# Patient Record
Sex: Female | Born: 1965 | Race: White | Hispanic: No | Marital: Single
Health system: Southern US, Community
[De-identification: ages and names within clinical notes are randomized; demographics above are authoritative.]

---

## 1998-05-12 ENCOUNTER — Ambulatory Visit (HOSPITAL_COMMUNITY): Admission: RE | Admit: 1998-05-12 | Discharge: 1998-05-12 | Payer: Self-pay | Admitting: Obstetrics and Gynecology

## 1999-03-01 ENCOUNTER — Other Ambulatory Visit: Admission: RE | Admit: 1999-03-01 | Discharge: 1999-03-01 | Payer: Self-pay | Admitting: Obstetrics and Gynecology

## 2000-02-16 ENCOUNTER — Other Ambulatory Visit: Admission: RE | Admit: 2000-02-16 | Discharge: 2000-02-16 | Payer: Self-pay | Admitting: Obstetrics and Gynecology

## 2001-02-28 ENCOUNTER — Other Ambulatory Visit: Admission: RE | Admit: 2001-02-28 | Discharge: 2001-02-28 | Payer: Self-pay | Admitting: Obstetrics and Gynecology

## 2001-06-04 ENCOUNTER — Other Ambulatory Visit: Admission: RE | Admit: 2001-06-04 | Discharge: 2001-06-04 | Payer: Self-pay | Admitting: Internal Medicine

## 2001-09-07 ENCOUNTER — Other Ambulatory Visit: Admission: RE | Admit: 2001-09-07 | Discharge: 2001-09-07 | Payer: Self-pay | Admitting: Obstetrics and Gynecology

## 2002-02-28 ENCOUNTER — Other Ambulatory Visit: Admission: RE | Admit: 2002-02-28 | Discharge: 2002-02-28 | Payer: Self-pay | Admitting: Obstetrics and Gynecology

## 2003-03-03 ENCOUNTER — Other Ambulatory Visit: Admission: RE | Admit: 2003-03-03 | Discharge: 2003-03-03 | Payer: Self-pay | Admitting: Obstetrics and Gynecology

## 2004-03-04 ENCOUNTER — Other Ambulatory Visit: Admission: RE | Admit: 2004-03-04 | Discharge: 2004-03-04 | Payer: Self-pay | Admitting: Obstetrics and Gynecology

## 2004-03-23 ENCOUNTER — Encounter: Admission: RE | Admit: 2004-03-23 | Discharge: 2004-03-23 | Payer: Self-pay | Admitting: Obstetrics and Gynecology

## 2005-03-17 ENCOUNTER — Other Ambulatory Visit: Admission: RE | Admit: 2005-03-17 | Discharge: 2005-03-17 | Payer: Self-pay | Admitting: Obstetrics and Gynecology

## 2005-04-20 ENCOUNTER — Encounter: Admission: RE | Admit: 2005-04-20 | Discharge: 2005-04-20 | Payer: Self-pay | Admitting: Occupational Medicine

## 2005-05-10 ENCOUNTER — Encounter: Admission: RE | Admit: 2005-05-10 | Discharge: 2005-05-10 | Payer: Self-pay | Admitting: Obstetrics and Gynecology

## 2006-03-24 ENCOUNTER — Other Ambulatory Visit: Admission: RE | Admit: 2006-03-24 | Discharge: 2006-03-24 | Payer: Self-pay | Admitting: Obstetrics and Gynecology

## 2006-06-22 ENCOUNTER — Encounter: Admission: RE | Admit: 2006-06-22 | Discharge: 2006-06-22 | Payer: Self-pay | Admitting: Obstetrics and Gynecology

## 2006-06-29 ENCOUNTER — Encounter: Admission: RE | Admit: 2006-06-29 | Discharge: 2006-06-29 | Payer: Self-pay | Admitting: Obstetrics and Gynecology

## 2007-07-19 ENCOUNTER — Encounter: Admission: RE | Admit: 2007-07-19 | Discharge: 2007-07-19 | Payer: Self-pay | Admitting: Obstetrics and Gynecology

## 2008-10-17 ENCOUNTER — Encounter: Admission: RE | Admit: 2008-10-17 | Discharge: 2008-10-17 | Payer: Self-pay | Admitting: Obstetrics and Gynecology

## 2008-10-21 ENCOUNTER — Encounter: Admission: RE | Admit: 2008-10-21 | Discharge: 2008-10-21 | Payer: Self-pay | Admitting: Obstetrics and Gynecology

## 2009-10-19 ENCOUNTER — Encounter: Admission: RE | Admit: 2009-10-19 | Discharge: 2009-10-19 | Payer: Self-pay | Admitting: Obstetrics and Gynecology

## 2010-10-17 ENCOUNTER — Encounter: Payer: Self-pay | Admitting: Obstetrics and Gynecology

## 2010-10-20 ENCOUNTER — Encounter
Admission: RE | Admit: 2010-10-20 | Discharge: 2010-10-20 | Payer: Self-pay | Source: Home / Self Care | Attending: Obstetrics and Gynecology | Admitting: Obstetrics and Gynecology

## 2011-09-23 ENCOUNTER — Other Ambulatory Visit: Payer: Self-pay | Admitting: Obstetrics and Gynecology

## 2011-09-23 DIAGNOSIS — Z1231 Encounter for screening mammogram for malignant neoplasm of breast: Secondary | ICD-10-CM

## 2011-10-24 ENCOUNTER — Ambulatory Visit
Admission: RE | Admit: 2011-10-24 | Discharge: 2011-10-24 | Disposition: A | Discharge status: Home / Self Care | Payer: BC Managed Care – PPO | Source: Ambulatory Visit | Attending: Obstetrics and Gynecology | Admitting: Obstetrics and Gynecology

## 2011-10-24 DIAGNOSIS — Z1231 Encounter for screening mammogram for malignant neoplasm of breast: Secondary | ICD-10-CM

## 2012-09-27 ENCOUNTER — Other Ambulatory Visit: Payer: Self-pay | Admitting: Obstetrics and Gynecology

## 2012-09-27 DIAGNOSIS — Z1231 Encounter for screening mammogram for malignant neoplasm of breast: Secondary | ICD-10-CM

## 2012-10-24 ENCOUNTER — Ambulatory Visit: Payer: BC Managed Care – PPO

## 2012-10-29 ENCOUNTER — Ambulatory Visit
Admission: RE | Admit: 2012-10-29 | Discharge: 2012-10-29 | Disposition: A | Discharge status: Home / Self Care | Payer: BC Managed Care – PPO | Source: Ambulatory Visit | Attending: Obstetrics and Gynecology | Admitting: Obstetrics and Gynecology

## 2012-10-29 DIAGNOSIS — Z1231 Encounter for screening mammogram for malignant neoplasm of breast: Secondary | ICD-10-CM

## 2012-10-30 ENCOUNTER — Other Ambulatory Visit: Payer: Self-pay | Admitting: Obstetrics and Gynecology

## 2012-10-30 DIAGNOSIS — R928 Other abnormal and inconclusive findings on diagnostic imaging of breast: Secondary | ICD-10-CM

## 2012-11-09 ENCOUNTER — Other Ambulatory Visit: Payer: BC Managed Care – PPO

## 2012-11-15 ENCOUNTER — Ambulatory Visit
Admission: RE | Admit: 2012-11-15 | Discharge: 2012-11-15 | Disposition: A | Discharge status: Home / Self Care | Payer: BC Managed Care – PPO | Source: Ambulatory Visit | Attending: Obstetrics and Gynecology | Admitting: Obstetrics and Gynecology

## 2012-11-15 DIAGNOSIS — R928 Other abnormal and inconclusive findings on diagnostic imaging of breast: Secondary | ICD-10-CM

## 2013-10-18 ENCOUNTER — Other Ambulatory Visit: Payer: Self-pay

## 2013-10-18 DIAGNOSIS — Z1231 Encounter for screening mammogram for malignant neoplasm of breast: Secondary | ICD-10-CM

## 2013-11-06 ENCOUNTER — Ambulatory Visit
Admission: RE | Admit: 2013-11-06 | Discharge: 2013-11-06 | Disposition: A | Discharge status: Home / Self Care | Payer: No Typology Code available for payment source | Source: Ambulatory Visit

## 2013-11-06 DIAGNOSIS — Z1231 Encounter for screening mammogram for malignant neoplasm of breast: Secondary | ICD-10-CM

## 2014-10-09 ENCOUNTER — Other Ambulatory Visit: Payer: Self-pay

## 2014-10-09 DIAGNOSIS — Z1231 Encounter for screening mammogram for malignant neoplasm of breast: Secondary | ICD-10-CM

## 2014-11-10 ENCOUNTER — Ambulatory Visit: Payer: No Typology Code available for payment source

## 2014-11-13 ENCOUNTER — Ambulatory Visit
Admission: RE | Admit: 2014-11-13 | Discharge: 2014-11-13 | Disposition: A | Discharge status: Home / Self Care | Payer: No Typology Code available for payment source | Source: Ambulatory Visit

## 2014-11-13 DIAGNOSIS — Z1231 Encounter for screening mammogram for malignant neoplasm of breast: Secondary | ICD-10-CM

## 2015-10-14 ENCOUNTER — Other Ambulatory Visit: Payer: Self-pay

## 2015-10-14 DIAGNOSIS — Z1231 Encounter for screening mammogram for malignant neoplasm of breast: Secondary | ICD-10-CM

## 2015-11-16 ENCOUNTER — Ambulatory Visit
Admission: RE | Admit: 2015-11-16 | Discharge: 2015-11-16 | Disposition: A | Discharge status: Home / Self Care | Payer: 59 | Source: Ambulatory Visit

## 2015-11-16 DIAGNOSIS — Z1231 Encounter for screening mammogram for malignant neoplasm of breast: Secondary | ICD-10-CM

## 2016-10-17 ENCOUNTER — Other Ambulatory Visit: Payer: Self-pay | Admitting: Family Medicine

## 2016-10-17 DIAGNOSIS — Z1231 Encounter for screening mammogram for malignant neoplasm of breast: Secondary | ICD-10-CM

## 2016-11-16 ENCOUNTER — Ambulatory Visit
Admission: RE | Admit: 2016-11-16 | Discharge: 2016-11-16 | Disposition: A | Discharge status: Home / Self Care | Payer: 59 | Source: Ambulatory Visit | Attending: Family Medicine | Admitting: Family Medicine

## 2016-11-16 DIAGNOSIS — Z1231 Encounter for screening mammogram for malignant neoplasm of breast: Secondary | ICD-10-CM

## 2017-07-03 DIAGNOSIS — I10 Essential (primary) hypertension: Secondary | ICD-10-CM | POA: Diagnosis not present

## 2017-07-03 DIAGNOSIS — Z13220 Encounter for screening for lipoid disorders: Secondary | ICD-10-CM | POA: Diagnosis not present

## 2017-07-04 ENCOUNTER — Other Ambulatory Visit (HOSPITAL_COMMUNITY)
Admission: RE | Admit: 2017-07-04 | Discharge: 2017-07-04 | Disposition: A | Discharge status: Home / Self Care | Payer: 59 | Source: Ambulatory Visit | Attending: Family Medicine | Admitting: Family Medicine

## 2017-07-04 ENCOUNTER — Other Ambulatory Visit: Payer: Self-pay | Admitting: Family Medicine

## 2017-07-04 DIAGNOSIS — Z01411 Encounter for gynecological examination (general) (routine) with abnormal findings: Secondary | ICD-10-CM | POA: Diagnosis not present

## 2017-07-04 DIAGNOSIS — I10 Essential (primary) hypertension: Secondary | ICD-10-CM | POA: Diagnosis not present

## 2017-07-04 DIAGNOSIS — Z13220 Encounter for screening for lipoid disorders: Secondary | ICD-10-CM | POA: Diagnosis not present

## 2017-07-04 DIAGNOSIS — Z23 Encounter for immunization: Secondary | ICD-10-CM | POA: Diagnosis not present

## 2017-07-04 DIAGNOSIS — Z124 Encounter for screening for malignant neoplasm of cervix: Secondary | ICD-10-CM | POA: Diagnosis not present

## 2017-07-04 DIAGNOSIS — Z0000 Encounter for general adult medical examination without abnormal findings: Secondary | ICD-10-CM | POA: Diagnosis not present

## 2017-07-05 LAB — CYTOLOGY - PAP
Diagnosis: NEGATIVE
HPV (WINDOPATH): NOT DETECTED

## 2017-08-30 DIAGNOSIS — D485 Neoplasm of uncertain behavior of skin: Secondary | ICD-10-CM | POA: Diagnosis not present

## 2017-08-30 DIAGNOSIS — D225 Melanocytic nevi of trunk: Secondary | ICD-10-CM | POA: Diagnosis not present

## 2017-08-30 DIAGNOSIS — Z86018 Personal history of other benign neoplasm: Secondary | ICD-10-CM | POA: Diagnosis not present

## 2017-08-30 DIAGNOSIS — L918 Other hypertrophic disorders of the skin: Secondary | ICD-10-CM | POA: Diagnosis not present

## 2017-08-30 DIAGNOSIS — Z23 Encounter for immunization: Secondary | ICD-10-CM | POA: Diagnosis not present

## 2017-08-30 DIAGNOSIS — D2222 Melanocytic nevi of left ear and external auricular canal: Secondary | ICD-10-CM | POA: Diagnosis not present

## 2017-10-09 ENCOUNTER — Other Ambulatory Visit: Payer: Self-pay | Admitting: Family Medicine

## 2017-10-09 DIAGNOSIS — Z139 Encounter for screening, unspecified: Secondary | ICD-10-CM

## 2017-11-17 ENCOUNTER — Ambulatory Visit
Admission: RE | Admit: 2017-11-17 | Discharge: 2017-11-17 | Disposition: A | Discharge status: Home / Self Care | Payer: 59 | Source: Ambulatory Visit | Attending: Family Medicine | Admitting: Family Medicine

## 2017-11-17 DIAGNOSIS — Z1231 Encounter for screening mammogram for malignant neoplasm of breast: Secondary | ICD-10-CM | POA: Diagnosis not present

## 2017-11-17 DIAGNOSIS — Z139 Encounter for screening, unspecified: Secondary | ICD-10-CM

## 2017-11-21 DIAGNOSIS — N920 Excessive and frequent menstruation with regular cycle: Secondary | ICD-10-CM | POA: Diagnosis not present

## 2017-11-21 DIAGNOSIS — G43909 Migraine, unspecified, not intractable, without status migrainosus: Secondary | ICD-10-CM | POA: Diagnosis not present

## 2017-11-21 DIAGNOSIS — R69 Illness, unspecified: Secondary | ICD-10-CM | POA: Diagnosis not present

## 2017-11-21 DIAGNOSIS — N951 Menopausal and female climacteric states: Secondary | ICD-10-CM | POA: Diagnosis not present

## 2018-02-21 DIAGNOSIS — R5382 Chronic fatigue, unspecified: Secondary | ICD-10-CM | POA: Diagnosis not present

## 2018-02-21 DIAGNOSIS — N951 Menopausal and female climacteric states: Secondary | ICD-10-CM | POA: Diagnosis not present

## 2018-02-21 DIAGNOSIS — M25511 Pain in right shoulder: Secondary | ICD-10-CM | POA: Diagnosis not present

## 2018-02-23 DIAGNOSIS — M67911 Unspecified disorder of synovium and tendon, right shoulder: Secondary | ICD-10-CM | POA: Diagnosis not present

## 2018-02-26 DIAGNOSIS — M7541 Impingement syndrome of right shoulder: Secondary | ICD-10-CM | POA: Diagnosis not present

## 2018-02-26 DIAGNOSIS — M25511 Pain in right shoulder: Secondary | ICD-10-CM | POA: Diagnosis not present

## 2018-03-26 DIAGNOSIS — R0600 Dyspnea, unspecified: Secondary | ICD-10-CM | POA: Diagnosis not present

## 2018-03-26 DIAGNOSIS — R0683 Snoring: Secondary | ICD-10-CM | POA: Diagnosis not present

## 2018-03-26 DIAGNOSIS — G4719 Other hypersomnia: Secondary | ICD-10-CM | POA: Diagnosis not present

## 2018-03-26 DIAGNOSIS — G478 Other sleep disorders: Secondary | ICD-10-CM | POA: Diagnosis not present

## 2018-04-16 DIAGNOSIS — G4733 Obstructive sleep apnea (adult) (pediatric): Secondary | ICD-10-CM | POA: Diagnosis not present

## 2018-05-15 DIAGNOSIS — G4733 Obstructive sleep apnea (adult) (pediatric): Secondary | ICD-10-CM | POA: Diagnosis not present

## 2018-06-14 DIAGNOSIS — Z23 Encounter for immunization: Secondary | ICD-10-CM | POA: Diagnosis not present

## 2018-06-15 DIAGNOSIS — G4733 Obstructive sleep apnea (adult) (pediatric): Secondary | ICD-10-CM | POA: Diagnosis not present

## 2018-06-18 DIAGNOSIS — G4733 Obstructive sleep apnea (adult) (pediatric): Secondary | ICD-10-CM | POA: Diagnosis not present

## 2018-06-19 DIAGNOSIS — G4733 Obstructive sleep apnea (adult) (pediatric): Secondary | ICD-10-CM | POA: Diagnosis not present

## 2018-07-15 DIAGNOSIS — G4733 Obstructive sleep apnea (adult) (pediatric): Secondary | ICD-10-CM | POA: Diagnosis not present

## 2018-07-23 DIAGNOSIS — I10 Essential (primary) hypertension: Secondary | ICD-10-CM | POA: Diagnosis not present

## 2018-07-23 DIAGNOSIS — Z0000 Encounter for general adult medical examination without abnormal findings: Secondary | ICD-10-CM | POA: Diagnosis not present

## 2018-07-23 DIAGNOSIS — Z13220 Encounter for screening for lipoid disorders: Secondary | ICD-10-CM | POA: Diagnosis not present

## 2018-08-15 DIAGNOSIS — G4733 Obstructive sleep apnea (adult) (pediatric): Secondary | ICD-10-CM | POA: Diagnosis not present

## 2018-08-20 DIAGNOSIS — G4733 Obstructive sleep apnea (adult) (pediatric): Secondary | ICD-10-CM | POA: Diagnosis not present

## 2018-09-14 DIAGNOSIS — G4733 Obstructive sleep apnea (adult) (pediatric): Secondary | ICD-10-CM | POA: Diagnosis not present

## 2018-09-25 DIAGNOSIS — G4733 Obstructive sleep apnea (adult) (pediatric): Secondary | ICD-10-CM | POA: Diagnosis not present

## 2018-10-03 DIAGNOSIS — L309 Dermatitis, unspecified: Secondary | ICD-10-CM | POA: Diagnosis not present

## 2018-10-03 DIAGNOSIS — D2222 Melanocytic nevi of left ear and external auricular canal: Secondary | ICD-10-CM | POA: Diagnosis not present

## 2018-10-03 DIAGNOSIS — Z23 Encounter for immunization: Secondary | ICD-10-CM | POA: Diagnosis not present

## 2018-10-03 DIAGNOSIS — Z86018 Personal history of other benign neoplasm: Secondary | ICD-10-CM | POA: Diagnosis not present

## 2018-10-03 DIAGNOSIS — D225 Melanocytic nevi of trunk: Secondary | ICD-10-CM | POA: Diagnosis not present

## 2018-10-15 DIAGNOSIS — G4733 Obstructive sleep apnea (adult) (pediatric): Secondary | ICD-10-CM | POA: Diagnosis not present

## 2018-10-16 ENCOUNTER — Other Ambulatory Visit: Payer: Self-pay | Admitting: Family Medicine

## 2018-10-16 DIAGNOSIS — Z1231 Encounter for screening mammogram for malignant neoplasm of breast: Secondary | ICD-10-CM

## 2018-11-15 DIAGNOSIS — G4733 Obstructive sleep apnea (adult) (pediatric): Secondary | ICD-10-CM | POA: Diagnosis not present

## 2018-11-28 ENCOUNTER — Ambulatory Visit: Payer: 59

## 2018-12-05 ENCOUNTER — Ambulatory Visit: Payer: 59

## 2018-12-14 DIAGNOSIS — G4733 Obstructive sleep apnea (adult) (pediatric): Secondary | ICD-10-CM | POA: Diagnosis not present

## 2018-12-18 DIAGNOSIS — G4733 Obstructive sleep apnea (adult) (pediatric): Secondary | ICD-10-CM | POA: Diagnosis not present

## 2019-01-01 ENCOUNTER — Ambulatory Visit: Payer: 59

## 2019-01-14 DIAGNOSIS — G4733 Obstructive sleep apnea (adult) (pediatric): Secondary | ICD-10-CM | POA: Diagnosis not present

## 2019-02-05 ENCOUNTER — Ambulatory Visit
Admission: RE | Admit: 2019-02-05 | Discharge: 2019-02-05 | Disposition: A | Discharge status: Home / Self Care | Payer: 59 | Source: Ambulatory Visit | Attending: Family Medicine | Admitting: Family Medicine

## 2019-02-05 ENCOUNTER — Other Ambulatory Visit: Payer: Self-pay

## 2019-02-05 DIAGNOSIS — Z1231 Encounter for screening mammogram for malignant neoplasm of breast: Secondary | ICD-10-CM | POA: Diagnosis not present

## 2019-02-12 ENCOUNTER — Ambulatory Visit: Payer: 59

## 2019-02-13 DIAGNOSIS — G4733 Obstructive sleep apnea (adult) (pediatric): Secondary | ICD-10-CM | POA: Diagnosis not present

## 2019-04-05 DIAGNOSIS — G4733 Obstructive sleep apnea (adult) (pediatric): Secondary | ICD-10-CM | POA: Diagnosis not present

## 2019-07-05 DIAGNOSIS — Z23 Encounter for immunization: Secondary | ICD-10-CM | POA: Diagnosis not present

## 2019-07-05 DIAGNOSIS — G4733 Obstructive sleep apnea (adult) (pediatric): Secondary | ICD-10-CM | POA: Diagnosis not present

## 2019-08-05 DIAGNOSIS — G4733 Obstructive sleep apnea (adult) (pediatric): Secondary | ICD-10-CM | POA: Diagnosis not present

## 2019-08-21 DIAGNOSIS — G4733 Obstructive sleep apnea (adult) (pediatric): Secondary | ICD-10-CM | POA: Diagnosis not present

## 2019-08-30 DIAGNOSIS — Z0000 Encounter for general adult medical examination without abnormal findings: Secondary | ICD-10-CM | POA: Diagnosis not present

## 2019-09-04 DIAGNOSIS — G4733 Obstructive sleep apnea (adult) (pediatric): Secondary | ICD-10-CM | POA: Diagnosis not present

## 2019-09-13 DIAGNOSIS — E039 Hypothyroidism, unspecified: Secondary | ICD-10-CM | POA: Diagnosis not present

## 2019-09-13 DIAGNOSIS — I10 Essential (primary) hypertension: Secondary | ICD-10-CM | POA: Diagnosis not present

## 2019-09-13 DIAGNOSIS — Z13220 Encounter for screening for lipoid disorders: Secondary | ICD-10-CM | POA: Diagnosis not present

## 2019-09-15 DIAGNOSIS — B349 Viral infection, unspecified: Secondary | ICD-10-CM | POA: Diagnosis not present

## 2019-09-15 DIAGNOSIS — K529 Noninfective gastroenteritis and colitis, unspecified: Secondary | ICD-10-CM | POA: Diagnosis not present

## 2019-10-02 DIAGNOSIS — L578 Other skin changes due to chronic exposure to nonionizing radiation: Secondary | ICD-10-CM | POA: Diagnosis not present

## 2019-10-02 DIAGNOSIS — Z86018 Personal history of other benign neoplasm: Secondary | ICD-10-CM | POA: Diagnosis not present

## 2019-10-02 DIAGNOSIS — Z23 Encounter for immunization: Secondary | ICD-10-CM | POA: Diagnosis not present

## 2019-10-02 DIAGNOSIS — D2222 Melanocytic nevi of left ear and external auricular canal: Secondary | ICD-10-CM | POA: Diagnosis not present

## 2019-10-02 DIAGNOSIS — L931 Subacute cutaneous lupus erythematosus: Secondary | ICD-10-CM | POA: Diagnosis not present

## 2019-10-02 DIAGNOSIS — D225 Melanocytic nevi of trunk: Secondary | ICD-10-CM | POA: Diagnosis not present

## 2019-10-02 DIAGNOSIS — L309 Dermatitis, unspecified: Secondary | ICD-10-CM | POA: Diagnosis not present

## 2019-10-02 DIAGNOSIS — L820 Inflamed seborrheic keratosis: Secondary | ICD-10-CM | POA: Diagnosis not present

## 2019-10-03 DIAGNOSIS — G4733 Obstructive sleep apnea (adult) (pediatric): Secondary | ICD-10-CM | POA: Diagnosis not present

## 2019-11-03 DIAGNOSIS — G4733 Obstructive sleep apnea (adult) (pediatric): Secondary | ICD-10-CM | POA: Diagnosis not present

## 2019-12-01 DIAGNOSIS — G4733 Obstructive sleep apnea (adult) (pediatric): Secondary | ICD-10-CM | POA: Diagnosis not present

## 2019-12-05 ENCOUNTER — Ambulatory Visit: Payer: 59 | Attending: Internal Medicine

## 2019-12-05 DIAGNOSIS — Z23 Encounter for immunization: Secondary | ICD-10-CM

## 2019-12-05 NOTE — Progress Notes (Signed)
   Covid-19 Vaccination Clinic  Name:  Kimberly Cobb    MRN: [id number] DOB: [birth year]  12/05/2019  [name] was observed post Covid-19 immunization for 15 minutes without incident. She was provided with Vaccine Information Sheet and instruction to access the V-Safe system.   [name] was instructed to call 911 with any severe reactions post vaccine: Marland Kitchen Difficulty breathing  . Swelling of face and throat  . A fast heartbeat  . A bad rash all over body  . Dizziness and weakness   Immunizations Administered    Name Date Dose VIS Date Route   Moderna COVID-19 Vaccine 12/05/2019 10:45 AM 0.5 mL 08/27/2019 Intramuscular   Manufacturer: Moderna   Lot: [id number]   NDC: 41660-630-16

## 2019-12-11 DIAGNOSIS — L308 Other specified dermatitis: Secondary | ICD-10-CM | POA: Diagnosis not present

## 2019-12-11 DIAGNOSIS — L219 Seborrheic dermatitis, unspecified: Secondary | ICD-10-CM | POA: Diagnosis not present

## 2019-12-11 DIAGNOSIS — L309 Dermatitis, unspecified: Secondary | ICD-10-CM | POA: Diagnosis not present

## 2020-01-07 ENCOUNTER — Ambulatory Visit: Payer: 59 | Attending: Internal Medicine

## 2020-01-07 ENCOUNTER — Other Ambulatory Visit: Payer: Self-pay | Admitting: Family Medicine

## 2020-01-07 DIAGNOSIS — Z1231 Encounter for screening mammogram for malignant neoplasm of breast: Secondary | ICD-10-CM

## 2020-01-07 DIAGNOSIS — Z23 Encounter for immunization: Secondary | ICD-10-CM

## 2020-01-07 NOTE — Progress Notes (Signed)
   Covid-19 Vaccination Clinic  Name:  Kimberly Cobb    MRN: [id number] DOB: 12/15/1965  01/07/2020  Kimberly Cobb was observed post Covid-19 immunization for 15 minutes without incident. She was provided with Vaccine Information Sheet and instruction to access the V-Safe system.   Kimberly Cobb was instructed to call 911 with any severe reactions post vaccine: Marland Kitchen Difficulty breathing  . Swelling of face and throat  . A fast heartbeat  . A bad rash all over body  . Dizziness and weakness   Immunizations Administered    Name Date Dose VIS Date Route   Moderna COVID-19 Vaccine 01/07/2020 11:38 AM 0.5 mL 08/27/2019 Intramuscular   Manufacturer: Moderna   Lot: [id number]   NDC: 36681-594-70

## 2020-01-13 DIAGNOSIS — G4733 Obstructive sleep apnea (adult) (pediatric): Secondary | ICD-10-CM | POA: Diagnosis not present

## 2020-01-25 IMAGING — MG DIGITAL SCREENING BILATERAL MAMMOGRAM WITH TOMO AND CAD
8 series · 8 of 24 positions shown · non-contrast
Comparison: Previous exam(s).

CLINICAL DATA: Screening.

EXAM:
DIGITAL SCREENING BILATERAL MAMMOGRAM WITH TOMO AND CAD

[R CC synth-2D]
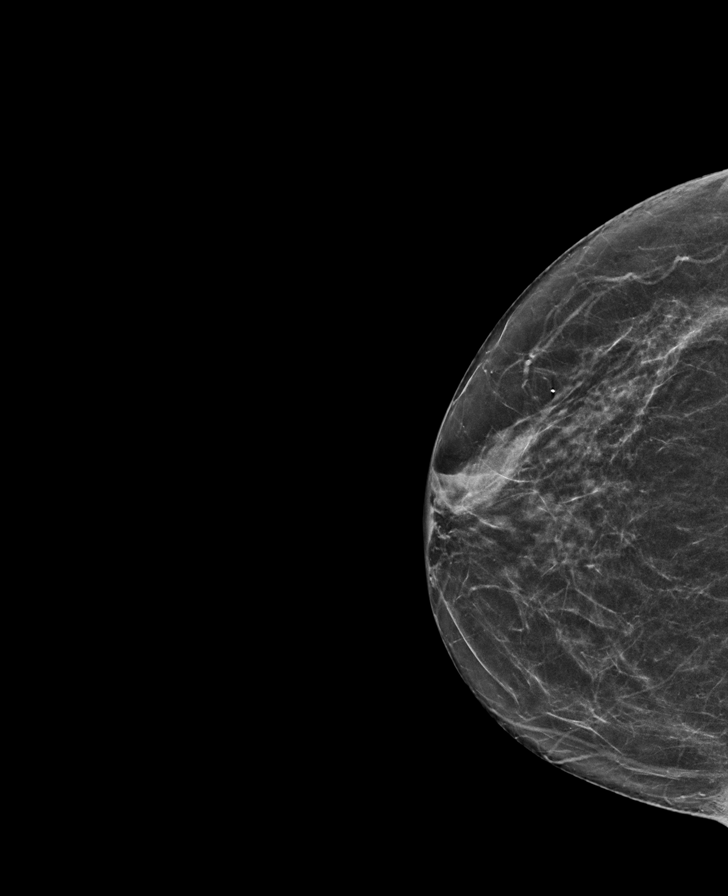

[R MLO synth-2D]
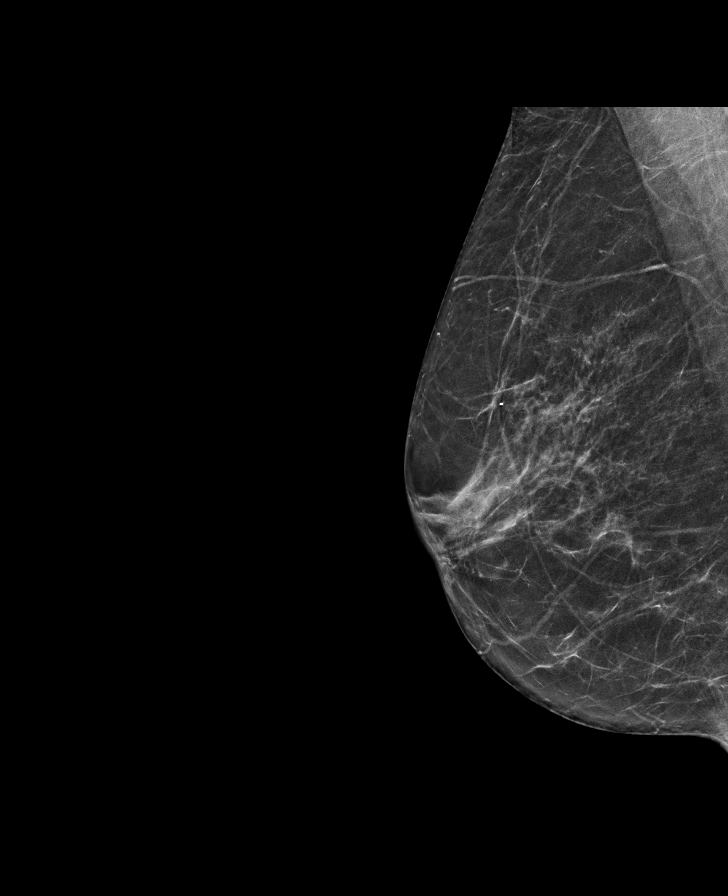

[L MLO synth-2D]
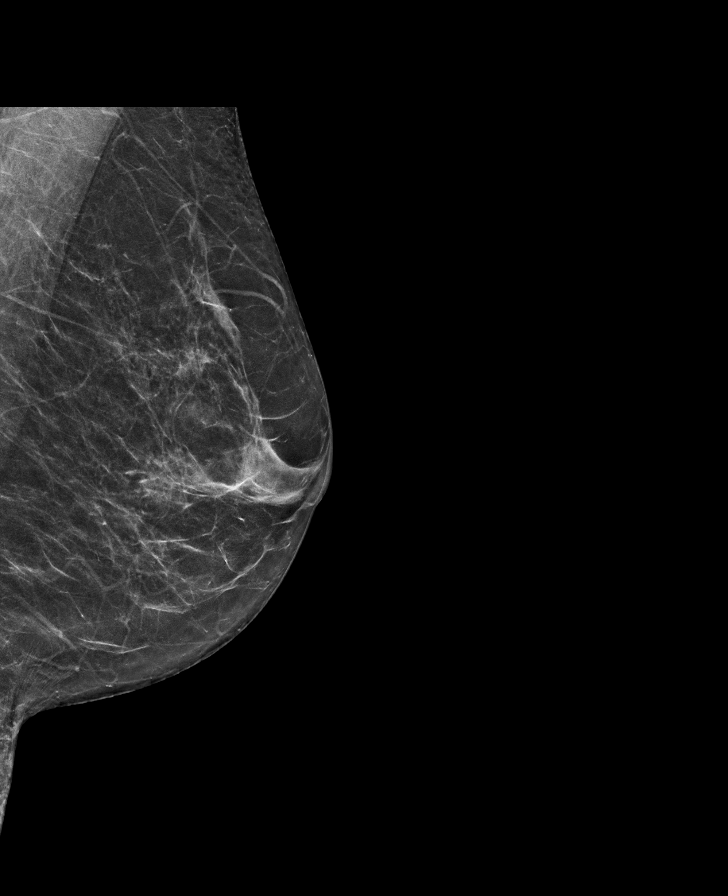

[L CC synth-2D]
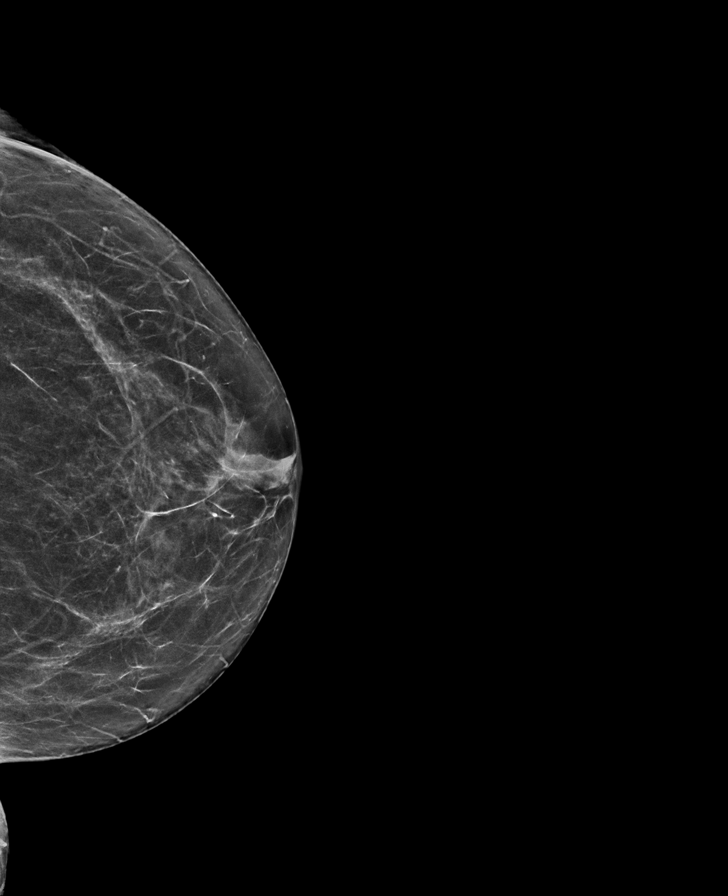

[L MLO tomo · tomo slice 33/65.0]
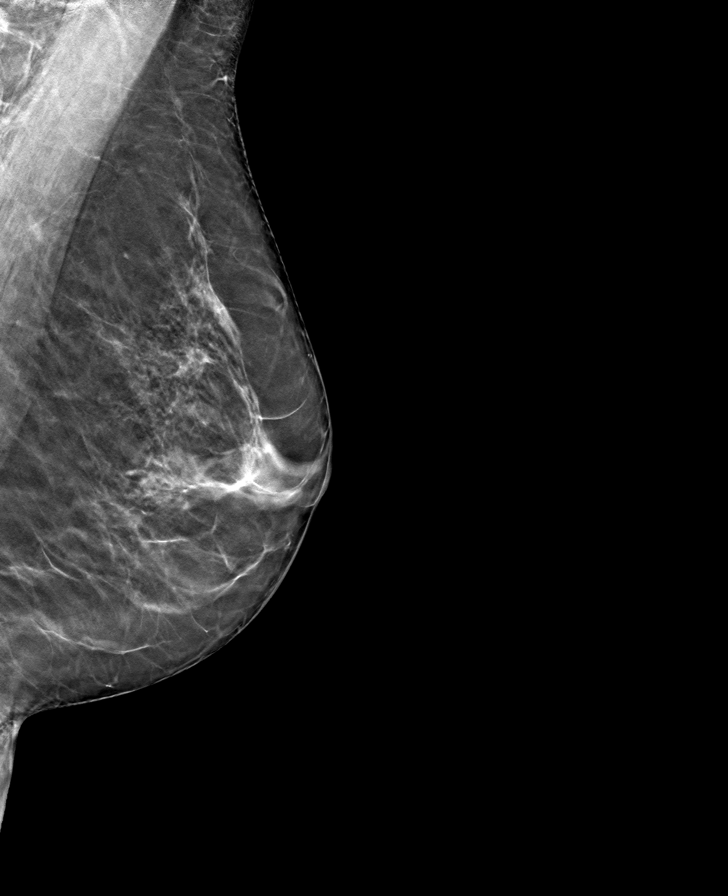

[L CC tomo · tomo slice 33/64.0]
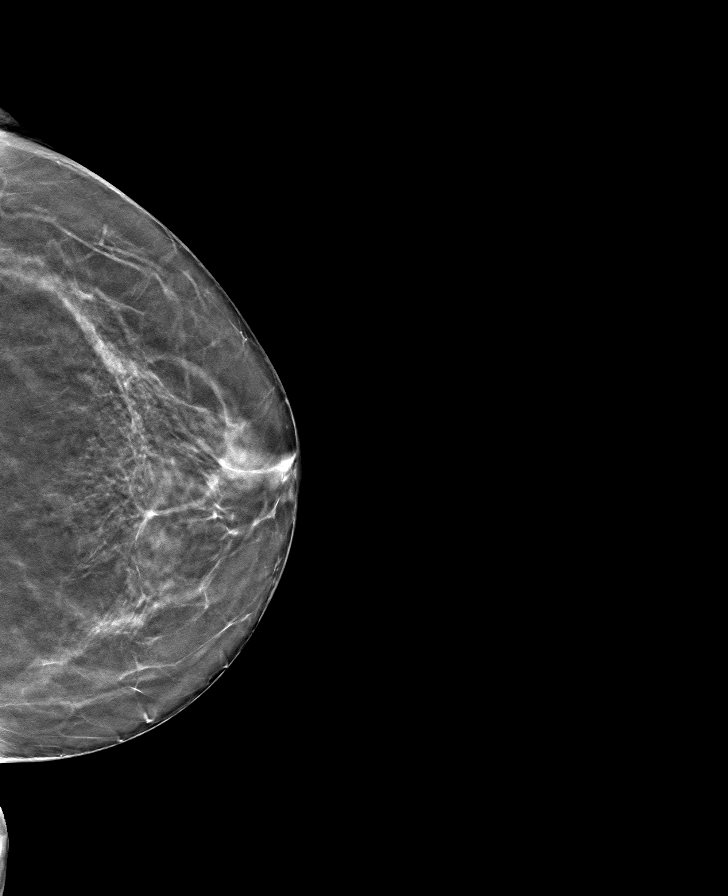

[R MLO tomo · tomo slice 33/66.0]
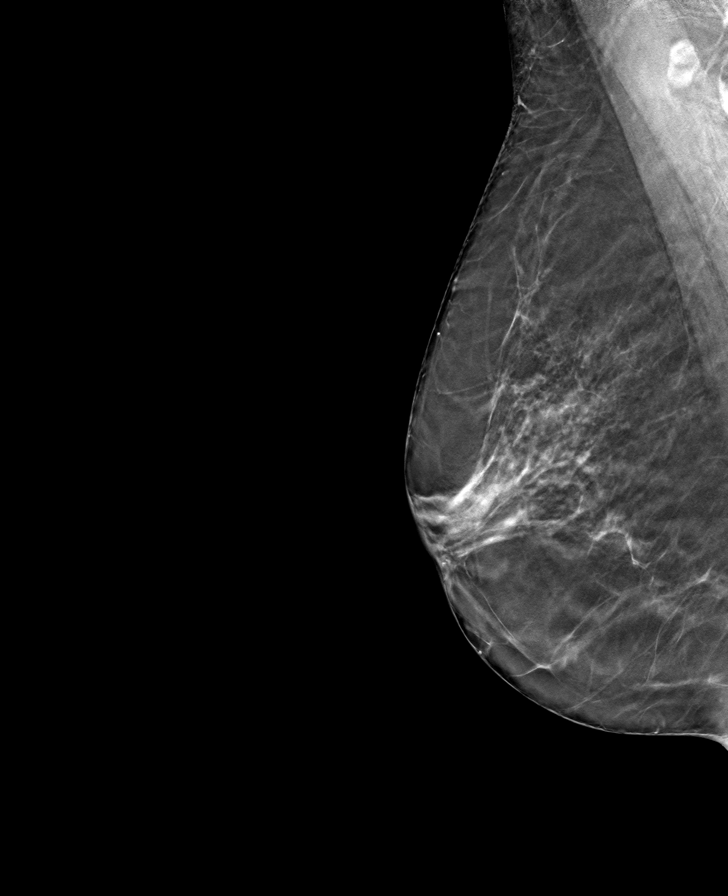

[R CC tomo · tomo slice 31/61.0]
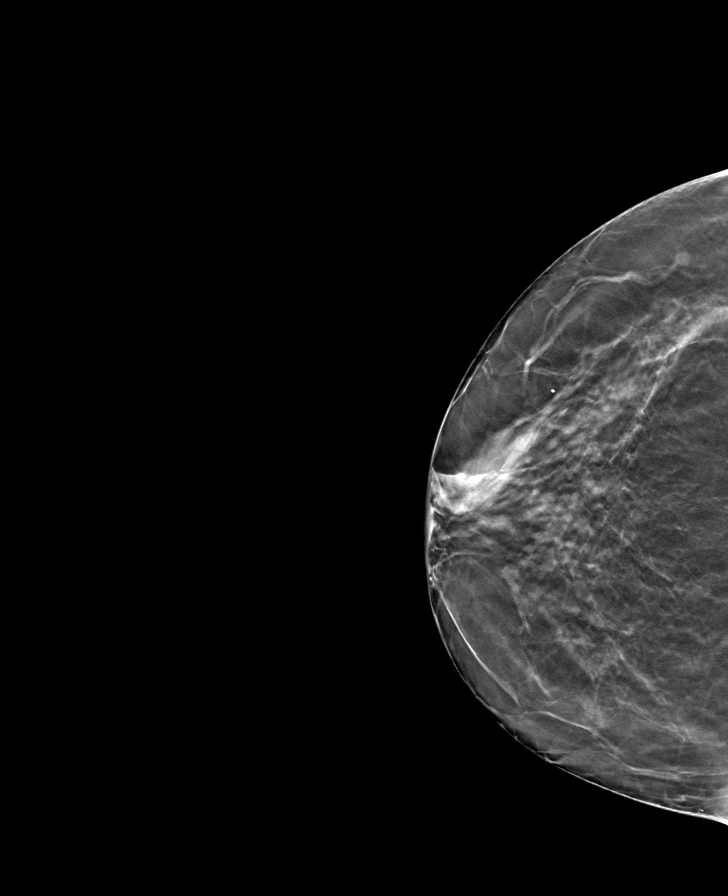

[8 of 24 positions shown; findings below may reference images not displayed]

ACR Breast Density Category b: There are scattered areas of
fibroglandular density.
FINDINGS: There are no findings suspicious for malignancy. Images were
processed with CAD.
IMPRESSION: No mammographic evidence of malignancy. A result letter of this
screening mammogram will be mailed directly to the patient.

RECOMMENDATION:
Screening mammogram in one year. (Code:[id number])

BI-RADS CATEGORY  1: Negative.

## 2020-02-12 DIAGNOSIS — G4733 Obstructive sleep apnea (adult) (pediatric): Secondary | ICD-10-CM | POA: Diagnosis not present

## 2020-03-14 DIAGNOSIS — G4733 Obstructive sleep apnea (adult) (pediatric): Secondary | ICD-10-CM | POA: Diagnosis not present

## 2020-03-17 ENCOUNTER — Other Ambulatory Visit: Payer: Self-pay

## 2020-03-17 ENCOUNTER — Ambulatory Visit
Admission: RE | Admit: 2020-03-17 | Discharge: 2020-03-17 | Disposition: A | Discharge status: Home / Self Care | Payer: 59 | Source: Ambulatory Visit | Attending: Family Medicine | Admitting: Family Medicine

## 2020-03-17 DIAGNOSIS — Z1231 Encounter for screening mammogram for malignant neoplasm of breast: Secondary | ICD-10-CM | POA: Diagnosis not present

## 2020-04-23 DIAGNOSIS — G4733 Obstructive sleep apnea (adult) (pediatric): Secondary | ICD-10-CM | POA: Diagnosis not present

## 2020-05-24 DIAGNOSIS — G4733 Obstructive sleep apnea (adult) (pediatric): Secondary | ICD-10-CM | POA: Diagnosis not present

## 2020-06-02 DIAGNOSIS — G4733 Obstructive sleep apnea (adult) (pediatric): Secondary | ICD-10-CM | POA: Diagnosis not present

## 2020-06-24 DIAGNOSIS — G4733 Obstructive sleep apnea (adult) (pediatric): Secondary | ICD-10-CM | POA: Diagnosis not present

## 2020-08-24 DIAGNOSIS — G4733 Obstructive sleep apnea (adult) (pediatric): Secondary | ICD-10-CM | POA: Diagnosis not present

## 2020-08-24 DIAGNOSIS — G4731 Primary central sleep apnea: Secondary | ICD-10-CM | POA: Diagnosis not present

## 2020-09-07 DIAGNOSIS — Z780 Asymptomatic menopausal state: Secondary | ICD-10-CM | POA: Diagnosis not present

## 2020-09-07 DIAGNOSIS — G4731 Primary central sleep apnea: Secondary | ICD-10-CM | POA: Diagnosis not present

## 2020-09-07 DIAGNOSIS — Z0000 Encounter for general adult medical examination without abnormal findings: Secondary | ICD-10-CM | POA: Diagnosis not present

## 2020-09-07 DIAGNOSIS — E039 Hypothyroidism, unspecified: Secondary | ICD-10-CM | POA: Diagnosis not present

## 2020-09-07 DIAGNOSIS — I10 Essential (primary) hypertension: Secondary | ICD-10-CM | POA: Diagnosis not present

## 2020-09-07 DIAGNOSIS — G4733 Obstructive sleep apnea (adult) (pediatric): Secondary | ICD-10-CM | POA: Diagnosis not present

## 2020-09-07 DIAGNOSIS — Z1211 Encounter for screening for malignant neoplasm of colon: Secondary | ICD-10-CM | POA: Diagnosis not present

## 2020-09-07 DIAGNOSIS — Z13220 Encounter for screening for lipoid disorders: Secondary | ICD-10-CM | POA: Diagnosis not present

## 2020-10-05 DIAGNOSIS — G4733 Obstructive sleep apnea (adult) (pediatric): Secondary | ICD-10-CM | POA: Diagnosis not present

## 2020-10-07 DIAGNOSIS — Z1211 Encounter for screening for malignant neoplasm of colon: Secondary | ICD-10-CM | POA: Diagnosis not present

## 2020-11-05 DIAGNOSIS — G4733 Obstructive sleep apnea (adult) (pediatric): Secondary | ICD-10-CM | POA: Diagnosis not present

## 2020-11-09 DIAGNOSIS — L578 Other skin changes due to chronic exposure to nonionizing radiation: Secondary | ICD-10-CM | POA: Diagnosis not present

## 2020-11-09 DIAGNOSIS — D2222 Melanocytic nevi of left ear and external auricular canal: Secondary | ICD-10-CM | POA: Diagnosis not present

## 2020-11-09 DIAGNOSIS — L930 Discoid lupus erythematosus: Secondary | ICD-10-CM | POA: Diagnosis not present

## 2020-11-09 DIAGNOSIS — Z86018 Personal history of other benign neoplasm: Secondary | ICD-10-CM | POA: Diagnosis not present

## 2020-11-09 DIAGNOSIS — D225 Melanocytic nevi of trunk: Secondary | ICD-10-CM | POA: Diagnosis not present

## 2020-11-09 DIAGNOSIS — L309 Dermatitis, unspecified: Secondary | ICD-10-CM | POA: Diagnosis not present

## 2020-12-03 DIAGNOSIS — G4733 Obstructive sleep apnea (adult) (pediatric): Secondary | ICD-10-CM | POA: Diagnosis not present

## 2020-12-24 ENCOUNTER — Other Ambulatory Visit: Payer: Self-pay | Admitting: *Deleted

## 2020-12-24 NOTE — Patient Outreach (Signed)
[facility]) Care Management  12/24/2020  Kimberly Cobb [birth year] [id number]  Unsuccessful outreach attempt made to patient. RN Health Coach left HIPAA compliant voicemail message along with her contact information.  Plan: RN Health Coach will call patient within the next several business days and will send an unsuccessful letter.    Blanchie Serve RN, BSN [facility] Care Management  RN Health Coach [phone] [email or web address]

## 2020-12-30 ENCOUNTER — Other Ambulatory Visit: Payer: Self-pay | Admitting: *Deleted

## 2020-12-30 NOTE — Patient Outreach (Signed)
[facility]) Care Management  12/30/2020  Kimberly Cobb Nov 24, 1965 [id number]  Unsuccessful outreach attempt made to patient. Nurse called patient to discuss [facility] services. Through conversation, the patient shared that her insurance changed to [place] on 12/25/20. Nurse explained that [facility] is not currently working with [employer]. Patient verbalized understanding.   Plan: RN Health Coach will close case.  Blanchie Serve RN, BSN [name] Care Management  RN Health Coach [phone] [email or web address]

## 2021-03-01 ENCOUNTER — Other Ambulatory Visit: Payer: Self-pay | Admitting: Family Medicine

## 2021-03-01 DIAGNOSIS — Z1231 Encounter for screening mammogram for malignant neoplasm of breast: Secondary | ICD-10-CM

## 2021-04-27 ENCOUNTER — Other Ambulatory Visit: Payer: Self-pay

## 2021-04-27 ENCOUNTER — Ambulatory Visit
Admission: RE | Admit: 2021-04-27 | Discharge: 2021-04-27 | Disposition: A | Discharge status: Home / Self Care | Payer: Managed Care, Other (non HMO) | Source: Ambulatory Visit

## 2021-04-27 DIAGNOSIS — Z1231 Encounter for screening mammogram for malignant neoplasm of breast: Secondary | ICD-10-CM

## 2022-04-06 DIAGNOSIS — K648 Other hemorrhoids: Secondary | ICD-10-CM | POA: Diagnosis not present

## 2022-04-06 DIAGNOSIS — Z86010 Personal history of colonic polyps: Secondary | ICD-10-CM | POA: Diagnosis not present

## 2022-04-26 DIAGNOSIS — G4733 Obstructive sleep apnea (adult) (pediatric): Secondary | ICD-10-CM | POA: Diagnosis not present

## 2022-05-04 DIAGNOSIS — L821 Other seborrheic keratosis: Secondary | ICD-10-CM | POA: Diagnosis not present

## 2022-05-04 DIAGNOSIS — L814 Other melanin hyperpigmentation: Secondary | ICD-10-CM | POA: Diagnosis not present

## 2022-05-04 DIAGNOSIS — D485 Neoplasm of uncertain behavior of skin: Secondary | ICD-10-CM | POA: Diagnosis not present

## 2022-05-04 DIAGNOSIS — D2260 Melanocytic nevi of unspecified upper limb, including shoulder: Secondary | ICD-10-CM | POA: Diagnosis not present

## 2022-05-09 ENCOUNTER — Other Ambulatory Visit: Payer: Self-pay | Admitting: Family Medicine

## 2022-05-09 DIAGNOSIS — Z1231 Encounter for screening mammogram for malignant neoplasm of breast: Secondary | ICD-10-CM

## 2022-05-20 ENCOUNTER — Ambulatory Visit
Admission: RE | Admit: 2022-05-20 | Discharge: 2022-05-20 | Disposition: A | Discharge status: Home / Self Care | Payer: BC Managed Care – PPO | Source: Ambulatory Visit | Attending: Family Medicine | Admitting: Family Medicine

## 2022-05-20 DIAGNOSIS — Z1231 Encounter for screening mammogram for malignant neoplasm of breast: Secondary | ICD-10-CM | POA: Diagnosis not present

## 2022-05-27 DIAGNOSIS — G4733 Obstructive sleep apnea (adult) (pediatric): Secondary | ICD-10-CM | POA: Diagnosis not present

## 2022-06-26 DIAGNOSIS — G4733 Obstructive sleep apnea (adult) (pediatric): Secondary | ICD-10-CM | POA: Diagnosis not present

## 2022-07-29 DIAGNOSIS — G4733 Obstructive sleep apnea (adult) (pediatric): Secondary | ICD-10-CM | POA: Diagnosis not present

## 2022-08-28 DIAGNOSIS — G4733 Obstructive sleep apnea (adult) (pediatric): Secondary | ICD-10-CM | POA: Diagnosis not present

## 2022-08-31 DIAGNOSIS — G4733 Obstructive sleep apnea (adult) (pediatric): Secondary | ICD-10-CM | POA: Diagnosis not present

## 2022-09-28 DIAGNOSIS — G4733 Obstructive sleep apnea (adult) (pediatric): Secondary | ICD-10-CM | POA: Diagnosis not present

## 2022-09-29 ENCOUNTER — Other Ambulatory Visit (HOSPITAL_COMMUNITY)
Admission: RE | Admit: 2022-09-29 | Discharge: 2022-09-29 | Disposition: A | Discharge status: Home / Self Care | Payer: BC Managed Care – PPO | Source: Ambulatory Visit | Attending: Family Medicine | Admitting: Family Medicine

## 2022-09-29 ENCOUNTER — Other Ambulatory Visit: Payer: Self-pay | Admitting: Family Medicine

## 2022-09-29 DIAGNOSIS — Z01411 Encounter for gynecological examination (general) (routine) with abnormal findings: Secondary | ICD-10-CM | POA: Diagnosis not present

## 2022-09-29 DIAGNOSIS — Z13220 Encounter for screening for lipoid disorders: Secondary | ICD-10-CM | POA: Diagnosis not present

## 2022-09-29 DIAGNOSIS — E039 Hypothyroidism, unspecified: Secondary | ICD-10-CM | POA: Diagnosis not present

## 2022-09-29 DIAGNOSIS — Z0000 Encounter for general adult medical examination without abnormal findings: Secondary | ICD-10-CM | POA: Diagnosis not present

## 2022-10-04 LAB — CYTOLOGY - PAP
Comment: NEGATIVE
Diagnosis: UNDETERMINED — AB
High risk HPV: NEGATIVE

## 2022-11-09 DIAGNOSIS — L931 Subacute cutaneous lupus erythematosus: Secondary | ICD-10-CM | POA: Diagnosis not present

## 2022-12-09 DIAGNOSIS — G4733 Obstructive sleep apnea (adult) (pediatric): Secondary | ICD-10-CM | POA: Diagnosis not present

## 2023-01-09 DIAGNOSIS — G4733 Obstructive sleep apnea (adult) (pediatric): Secondary | ICD-10-CM | POA: Diagnosis not present

## 2023-02-08 DIAGNOSIS — G4733 Obstructive sleep apnea (adult) (pediatric): Secondary | ICD-10-CM | POA: Diagnosis not present

## 2023-04-03 DIAGNOSIS — G4733 Obstructive sleep apnea (adult) (pediatric): Secondary | ICD-10-CM | POA: Diagnosis not present

## 2023-04-06 ENCOUNTER — Other Ambulatory Visit: Payer: Self-pay | Admitting: Family Medicine

## 2023-04-06 DIAGNOSIS — Z1231 Encounter for screening mammogram for malignant neoplasm of breast: Secondary | ICD-10-CM

## 2023-05-04 DIAGNOSIS — G4733 Obstructive sleep apnea (adult) (pediatric): Secondary | ICD-10-CM | POA: Diagnosis not present

## 2023-05-23 ENCOUNTER — Ambulatory Visit: Payer: BC Managed Care – PPO

## 2023-06-04 DIAGNOSIS — G4733 Obstructive sleep apnea (adult) (pediatric): Secondary | ICD-10-CM | POA: Diagnosis not present

## 2023-06-05 ENCOUNTER — Ambulatory Visit
Admission: RE | Admit: 2023-06-05 | Discharge: 2023-06-05 | Disposition: A | Discharge status: Home / Self Care | Payer: BC Managed Care – PPO | Source: Ambulatory Visit | Attending: Family Medicine | Admitting: Family Medicine

## 2023-06-05 DIAGNOSIS — Z1231 Encounter for screening mammogram for malignant neoplasm of breast: Secondary | ICD-10-CM

## 2023-06-14 DIAGNOSIS — L578 Other skin changes due to chronic exposure to nonionizing radiation: Secondary | ICD-10-CM | POA: Diagnosis not present

## 2023-06-14 DIAGNOSIS — L931 Subacute cutaneous lupus erythematosus: Secondary | ICD-10-CM | POA: Diagnosis not present

## 2023-06-14 DIAGNOSIS — D1801 Hemangioma of skin and subcutaneous tissue: Secondary | ICD-10-CM | POA: Diagnosis not present

## 2023-06-14 DIAGNOSIS — L814 Other melanin hyperpigmentation: Secondary | ICD-10-CM | POA: Diagnosis not present

## 2023-06-21 DIAGNOSIS — Z23 Encounter for immunization: Secondary | ICD-10-CM | POA: Diagnosis not present

## 2023-07-27 DIAGNOSIS — G4733 Obstructive sleep apnea (adult) (pediatric): Secondary | ICD-10-CM | POA: Diagnosis not present

## 2023-08-26 DIAGNOSIS — G4733 Obstructive sleep apnea (adult) (pediatric): Secondary | ICD-10-CM | POA: Diagnosis not present

## 2023-09-06 DIAGNOSIS — G4733 Obstructive sleep apnea (adult) (pediatric): Secondary | ICD-10-CM | POA: Diagnosis not present

## 2023-09-26 DIAGNOSIS — G4733 Obstructive sleep apnea (adult) (pediatric): Secondary | ICD-10-CM | POA: Diagnosis not present

## 2023-10-13 ENCOUNTER — Other Ambulatory Visit: Payer: Self-pay | Admitting: Family Medicine

## 2023-10-13 ENCOUNTER — Other Ambulatory Visit (HOSPITAL_COMMUNITY)
Admission: RE | Admit: 2023-10-13 | Discharge: 2023-10-13 | Disposition: A | Discharge status: Home / Self Care | Payer: BC Managed Care – PPO | Source: Ambulatory Visit | Attending: Family Medicine | Admitting: Family Medicine

## 2023-10-13 DIAGNOSIS — Z01411 Encounter for gynecological examination (general) (routine) with abnormal findings: Secondary | ICD-10-CM | POA: Diagnosis not present

## 2023-10-13 DIAGNOSIS — Z0000 Encounter for general adult medical examination without abnormal findings: Secondary | ICD-10-CM | POA: Diagnosis not present

## 2023-10-13 DIAGNOSIS — H6123 Impacted cerumen, bilateral: Secondary | ICD-10-CM | POA: Diagnosis not present

## 2023-10-13 DIAGNOSIS — Z13220 Encounter for screening for lipoid disorders: Secondary | ICD-10-CM | POA: Diagnosis not present

## 2023-10-13 DIAGNOSIS — Z124 Encounter for screening for malignant neoplasm of cervix: Secondary | ICD-10-CM | POA: Diagnosis not present

## 2023-10-19 LAB — CYTOLOGY - PAP: Diagnosis: NEGATIVE

## 2023-10-27 DIAGNOSIS — G4733 Obstructive sleep apnea (adult) (pediatric): Secondary | ICD-10-CM | POA: Diagnosis not present

## 2023-11-24 DIAGNOSIS — G4733 Obstructive sleep apnea (adult) (pediatric): Secondary | ICD-10-CM | POA: Diagnosis not present

## 2023-12-14 DIAGNOSIS — Z85828 Personal history of other malignant neoplasm of skin: Secondary | ICD-10-CM | POA: Diagnosis not present

## 2023-12-14 DIAGNOSIS — L219 Seborrheic dermatitis, unspecified: Secondary | ICD-10-CM | POA: Diagnosis not present

## 2023-12-14 DIAGNOSIS — L931 Subacute cutaneous lupus erythematosus: Secondary | ICD-10-CM | POA: Diagnosis not present

## 2023-12-14 DIAGNOSIS — L814 Other melanin hyperpigmentation: Secondary | ICD-10-CM | POA: Diagnosis not present

## 2023-12-25 DIAGNOSIS — G4733 Obstructive sleep apnea (adult) (pediatric): Secondary | ICD-10-CM | POA: Diagnosis not present

## 2024-02-09 DIAGNOSIS — G4733 Obstructive sleep apnea (adult) (pediatric): Secondary | ICD-10-CM | POA: Diagnosis not present

## 2024-03-11 DIAGNOSIS — G4733 Obstructive sleep apnea (adult) (pediatric): Secondary | ICD-10-CM | POA: Diagnosis not present

## 2024-04-10 DIAGNOSIS — G4733 Obstructive sleep apnea (adult) (pediatric): Secondary | ICD-10-CM | POA: Diagnosis not present

## 2024-05-16 DIAGNOSIS — G4733 Obstructive sleep apnea (adult) (pediatric): Secondary | ICD-10-CM | POA: Diagnosis not present

## 2024-06-04 ENCOUNTER — Other Ambulatory Visit: Payer: Self-pay | Admitting: Family Medicine

## 2024-06-04 DIAGNOSIS — Z1231 Encounter for screening mammogram for malignant neoplasm of breast: Secondary | ICD-10-CM

## 2024-06-19 ENCOUNTER — Ambulatory Visit
Admission: RE | Admit: 2024-06-19 | Discharge: 2024-06-19 | Disposition: A | Discharge status: Home / Self Care | Source: Ambulatory Visit | Attending: Family Medicine | Admitting: Family Medicine

## 2024-06-19 DIAGNOSIS — Z1231 Encounter for screening mammogram for malignant neoplasm of breast: Secondary | ICD-10-CM
